# Patient Record
Sex: Female | Born: 1975 | Race: Black or African American | Hispanic: No | Marital: Single | Smoking: Current some day smoker
Health system: Southern US, Community
[De-identification: ages and names within clinical notes are randomized; demographics above are authoritative.]

## PROBLEM LIST (undated history)

## (undated) DIAGNOSIS — I10 Essential (primary) hypertension: Secondary | ICD-10-CM

---

## 2019-08-29 ENCOUNTER — Other Ambulatory Visit: Payer: Self-pay

## 2020-09-14 ENCOUNTER — Encounter (HOSPITAL_COMMUNITY): Payer: Self-pay

## 2020-09-14 ENCOUNTER — Emergency Department (HOSPITAL_COMMUNITY): Payer: No Typology Code available for payment source

## 2020-09-14 ENCOUNTER — Other Ambulatory Visit: Payer: Self-pay

## 2020-09-14 ENCOUNTER — Emergency Department (HOSPITAL_COMMUNITY)
Admission: EM | Admit: 2020-09-14 | Discharge: 2020-09-14 | Disposition: A | Discharge status: Home / Self Care | Payer: No Typology Code available for payment source | Attending: Emergency Medicine | Admitting: Emergency Medicine

## 2020-09-14 DIAGNOSIS — S99922A Unspecified injury of left foot, initial encounter: Secondary | ICD-10-CM | POA: Diagnosis present

## 2020-09-14 DIAGNOSIS — S92415A Nondisplaced fracture of proximal phalanx of left great toe, initial encounter for closed fracture: Secondary | ICD-10-CM | POA: Diagnosis not present

## 2020-09-14 DIAGNOSIS — F17210 Nicotine dependence, cigarettes, uncomplicated: Secondary | ICD-10-CM | POA: Diagnosis not present

## 2020-09-14 DIAGNOSIS — M25562 Pain in left knee: Secondary | ICD-10-CM | POA: Insufficient documentation

## 2020-09-14 DIAGNOSIS — Y990 Civilian activity done for income or pay: Secondary | ICD-10-CM | POA: Diagnosis not present

## 2020-09-14 DIAGNOSIS — I10 Essential (primary) hypertension: Secondary | ICD-10-CM | POA: Diagnosis not present

## 2020-09-14 DIAGNOSIS — W010XXA Fall on same level from slipping, tripping and stumbling without subsequent striking against object, initial encounter: Secondary | ICD-10-CM | POA: Insufficient documentation

## 2020-09-14 HISTORY — DX: Essential (primary) hypertension: I10

## 2020-09-14 MED ORDER — OXYCODONE-ACETAMINOPHEN 5-325 MG PO TABS
1.0000 | ORAL_TABLET | Freq: Once | ORAL | Status: AC
  Administered 2020-09-14: 1 via ORAL
  Filled 2020-09-14: qty 1

## 2020-09-14 NOTE — ED Provider Notes (Signed)
Emergency Medicine Provider Triage Evaluation Note  Lindsey Hodges , a 45 y.o. female  was evaluated in triage.  Pt complains of mechanical nonsyncopal fall at work.  She states that she has had pain in her left knee and left big toe since this happened.  Difficult ambulating due to pain.  No other injuries.  Review of Systems  Positive: Pain in left knee and foot Negative: Fevers, head injury  Physical Exam  BP (!) 143/78 (BP Location: Right Arm)   Pulse 72   Temp 98.7 F (37.1 C) (Oral)   Resp 16   Ht 5\' 1"  (1.549 m)   Wt 95.3 kg   SpO2 99%   BMI 39.68 kg/m  Gen:   Awake, no distress   Resp:  Normal effort  MSK:   Moves extremities without difficulty except for left knee which has pain with ROM.  Other:  Superficial abrasion over left knee  Medical Decision Making  Medically screening exam initiated at 8:50 PM.  Appropriate orders placed.  Lindsey Hodges was informed that the remainder of the evaluation will be completed by another provider, this initial triage assessment does not replace that evaluation, and the importance of remaining in the ED until their evaluation is complete.  Note: Portions of this report may have been transcribed using voice recognition software. Every effort was made to ensure accuracy; however, inadvertent computerized transcription errors may be present    Genene Churn 09/14/20 2057    2058, MD 09/14/20 2241

## 2020-09-14 NOTE — ED Provider Notes (Addendum)
[facility] EMERGENCY DEPARTMENT Provider Note   CSN: [id number] Arrival date & time: 09/14/20  1900     History Chief Complaint  Patient presents with   Knee Pain   Toe Pain    Lindsey Hodges is a 45 y.o. female.  Patient presents ER chief complaint of left knee and left foot pain.  She states that she was at work when she tripped over something and fell.  She denies head injury denies headache or loss of consciousness.  Denies neck or back pain.  No reports of fevers or cough or vomiting or diarrhea.      Past Medical History:  Diagnosis Date   Hypertension     There are no problems to display for this patient.   History reviewed. No pertinent surgical history.   OB History   No obstetric history on file.     History reviewed. No pertinent family history.  Social History   Tobacco Use   Smoking status: Some Days    Packs/day: 1.00    Types: Cigarettes   Smokeless tobacco: Never  Substance Use Topics   Alcohol use: Not Currently   Drug use: Never    Home Medications Prior to Admission medications   Not on File    Allergies    Patient has no known allergies.  Review of Systems   Review of Systems  Constitutional:  Negative for fever.  HENT:  Negative for ear pain.   Eyes:  Negative for pain.  Respiratory:  Negative for cough.   Cardiovascular:  Negative for chest pain.  Gastrointestinal:  Negative for abdominal pain.  Genitourinary:  Negative for flank pain.  Musculoskeletal:  Negative for back pain.  Skin:  Negative for rash.  Neurological:  Negative for headaches.   Physical Exam Updated Vital Signs BP (!) 141/77 (BP Location: Right Arm)   Pulse 68   Temp 98.7 F (37.1 C) (Oral)   Resp 17   Ht 5\' 1"  (1.549 m)   Wt 95.3 kg   SpO2 99%   BMI 39.68 kg/m   Physical Exam Constitutional:      General: She is not in acute distress.    Appearance: Normal appearance.  HENT:     Head: Normocephalic.     Nose: Nose  normal.  Eyes:     Extraocular Movements: Extraocular movements intact.  Cardiovascular:     Rate and Rhythm: Normal rate.  Pulmonary:     Effort: Pulmonary effort is normal.  Musculoskeletal:     Cervical back: Normal range of motion.     Comments: Moderately decreased range of motion left knee secondary to pain.  Mild abrasion seen on the left knee.  No gross deformity noted.  Tenderness and swelling seen on the left first toe.  Bilateral lower extremities are neurovascularly intact.  Neurological:     General: No focal deficit present.     Mental Status: She is alert. Mental status is at baseline.    ED Results / Procedures / Treatments   Labs (all labs ordered are listed, but only abnormal results are displayed) Labs Reviewed - No data to display  EKG None  Radiology DG Knee Complete 4 Views Left  Result Date: 09/14/2020 CLINICAL DATA:  Status post fall EXAM: LEFT KNEE - COMPLETE 4+ VIEW COMPARISON:  None. FINDINGS: No evidence of fracture or dislocation. Question small joint effusion. No lipohemarthrosis. No evidence of severe arthropathy or other focal bone abnormality. Soft tissues are unremarkable. IMPRESSION: No  acute displaced fracture or dislocation. Electronically Signed   By: Tish Frederickson M.D.   On: 09/14/2020 21:28   DG Foot Complete Left  Result Date: 09/14/2020 CLINICAL DATA:  Status post fall EXAM: LEFT FOOT - COMPLETE 3+ VIEW COMPARISON:  Fall. FINDINGS: Acute intra-articular fracture of the head and neck of the first digit proximal phalanx. No other acute displaced fracture identified. No dislocation. There is no evidence of severe arthropathy or other focal bone abnormality. Soft tissues are unremarkable. IMPRESSION: Acute intra-articular fracture of the head and neck of the first digit proximal phalanx. Electronically Signed   By: Tish Frederickson M.D.   On: 09/14/2020 21:30    Procedures Procedures   Medications Ordered in ED Medications   oxyCODONE-acetaminophen (PERCOCET/ROXICET) 5-325 MG per tablet 1 tablet (1 tablet Oral Given 09/14/20 2223)    ED Course  I have reviewed the triage vital signs and the nursing notes.  Pertinent labs & imaging results that were available during my care of the patient were reviewed by me and considered in my medical decision making (see chart for details).    MDM Rules/Calculators/A&P                           Ancillary studies consistent with left foot first toe fracture.  This was buddy taped.  Neurovascularly intact after taping.  Recommend outpatient follow-up with orthopedics within the week.  Recommend immediate return for worsening pain fevers or additional concerns. Final Clinical Impression(s) / ED Diagnoses Final diagnoses:  Closed nondisplaced fracture of proximal phalanx of left great toe, initial encounter    Rx / DC Orders ED Discharge Orders     None        Cheryll Cockayne, MD 09/14/20 2244    Cheryll Cockayne, MD 09/14/20 2245

## 2020-09-14 NOTE — Progress Notes (Signed)
Orthopedic Tech Progress Note Patient Details:  Lindsey Hodges [birth year] [id number]  Ortho Devices Type of Ortho Device: Buddy tape, Crutches, Postop shoe/boot Ortho Device/Splint Location: LLE Ortho Device/Splint Interventions: Ordered, Application, Adjustment   Post Interventions Patient Tolerated: Well Instructions Provided: Adjustment of device, Care of device, Poper ambulation with device  Lindsey Hodges 09/14/2020, 11:10 PM

## 2020-09-14 NOTE — ED Triage Notes (Signed)
Left knee pain and left big toe pain after mechanical fall while at work.   Pt having difficulty weight bear on affected knee.

## 2020-09-14 NOTE — Discharge Instructions (Addendum)
Follow-up with orthopedic surgery within the week.  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  Please contact your human resources office to inquire about which doctors they want you to follow-up with.   Return immediately back to the ER if:  Your symptoms worsen within the next 12-24 hours. You develop new symptoms such as new fevers, persistent vomiting, new pain, shortness of breath, or new weakness or numbness, or if you have any other concerns.

## 2020-12-25 ENCOUNTER — Other Ambulatory Visit: Payer: Self-pay

## 2020-12-25 ENCOUNTER — Ambulatory Visit
Admission: EM | Admit: 2020-12-25 | Discharge: 2020-12-25 | Disposition: A | Discharge status: Home / Self Care | Payer: BLUE CROSS/BLUE SHIELD

## 2020-12-25 DIAGNOSIS — J069 Acute upper respiratory infection, unspecified: Secondary | ICD-10-CM

## 2020-12-25 NOTE — ED Triage Notes (Signed)
2 day h/o cough, HA, congestion, body aches, bilateral ear pain and intermittent fever. Has been taking robitussin and using cough drops and hot tea w/ some relief. Cough is interfering with her sleep. Confirms emesis x2 and one episode of diarrhea.

## 2020-12-25 NOTE — ED Provider Notes (Signed)
[facility]    CSN: [id number] Arrival date & time: 12/25/20  9323      History   Chief Complaint Chief Complaint  Patient presents with   Cough   Otalgia    bilateral    HPI Fifi Schindler is a 45 y.o. female.   Patient here today for evaluation of congestion, headache, body aches that started 2 days ago.  She reports she has had some vomiting and diarrhea as well.  She has had some fever with T-max of 100 Fahrenheit.  She tried over-the-counter medication without significant relief.  The history is provided by the patient.  Cough Associated symptoms: ear pain, fever, headaches, myalgias and sore throat   Associated symptoms: no eye discharge, no shortness of breath and no wheezing   Otalgia Associated symptoms: congestion, cough, diarrhea, fever, headaches, sore throat and vomiting    Past Medical History:  Diagnosis Date   Hypertension     There are no problems to display for this patient.   History reviewed. No pertinent surgical history.  OB History   No obstetric history on file.      Home Medications    Prior to Admission medications   Medication Sig Start Date End Date Taking? Authorizing Provider  amLODipine (NORVASC) 10 MG tablet amlodipine 10 mg tablet  TAKE 1 TABLET BY MOUTH EVERY DAY 12/22/19  Yes [provider]  valsartan (DIOVAN) 40 MG tablet Take 40 mg by mouth daily. 10/22/20   [provider]    Family History History reviewed. No pertinent family history.  Social History Social History   Tobacco Use   Smoking status: Some Days    Packs/day: 1.00    Types: Cigarettes   Smokeless tobacco: Never  Substance Use Topics   Alcohol use: Not Currently   Drug use: Never     Allergies   Patient has no known allergies.   Review of Systems Review of Systems  Constitutional:  Positive for fever.  HENT:  Positive for congestion, ear pain and sore throat.   Eyes:  Negative for discharge and redness.   Respiratory:  Positive for cough. Negative for shortness of breath and wheezing.   Gastrointestinal:  Positive for diarrhea, nausea and vomiting.  Musculoskeletal:  Positive for myalgias.  Neurological:  Positive for headaches.    Physical Exam Triage Vital Signs ED Triage Vitals  Enc Vitals Group     BP 12/25/20 1139 138/89     Pulse Rate 12/25/20 1139 81     Resp 12/25/20 1139 18     Temp 12/25/20 1139 99 F (37.2 C)     Temp Source 12/25/20 1139 Oral     SpO2 12/25/20 1139 95 %     Weight --      Height --      Head Circumference --      Peak Flow --      Pain Score 12/25/20 1141 0     Pain Loc --      Pain Edu? --      Excl. in GC? --    No data found.  Updated Vital Signs BP 138/89 (BP Location: Left Arm)   Pulse 81   Temp 99 F (37.2 C) (Oral)   Resp 18   SpO2 95%      Physical Exam Vitals and nursing note reviewed.  Constitutional:      General: She is not in acute distress.    Appearance: Normal appearance. She is not ill-appearing.  HENT:     Head: Normocephalic and atraumatic.     Right Ear: Tympanic membrane normal.     Left Ear: Tympanic membrane normal.     Nose: Congestion present.     Mouth/Throat:     Mouth: Mucous membranes are moist.     Pharynx: No oropharyngeal exudate or posterior oropharyngeal erythema.  Eyes:     Conjunctiva/sclera: Conjunctivae normal.  Cardiovascular:     Rate and Rhythm: Normal rate and regular rhythm.     Heart sounds: Normal heart sounds. No murmur heard. Pulmonary:     Effort: Pulmonary effort is normal. No respiratory distress.     Breath sounds: Normal breath sounds. No wheezing, rhonchi or rales.  Skin:    General: Skin is warm and dry.  Neurological:     Mental Status: She is alert.  Psychiatric:        Mood and Affect: Mood normal.        Thought Content: Thought content normal.     UC Treatments / Results  Labs (all labs ordered are listed, but only abnormal results are displayed) Labs  Reviewed  COVID-19, FLU A+B NAA    EKG   Radiology No results found.  Procedures Procedures (including critical care time)  Medications Ordered in UC Medications - No data to display  Initial Impression / Assessment and Plan / UC Course  I have reviewed the triage vital signs and the nursing notes.  Pertinent labs & imaging results that were available during my care of the patient were reviewed by me and considered in my medical decision making (see chart for details).    Suspect viral etiology of symptoms.  COVID and flu screening ordered.  Patient declines empiric Tamiflu.  Commend symptomatic treatment, increase fluids and rest.  Encouraged follow-up with any further concerns.  Final Clinical Impressions(s) / UC Diagnoses   Final diagnoses:  Acute upper respiratory infection   Discharge Instructions   None    ED Prescriptions   None    PDMP not reviewed this encounter.   Tomi Bamberger, PA-C 12/25/20 1239

## 2020-12-26 LAB — COVID-19, FLU A+B NAA
Influenza A, NAA: DETECTED — AB
Influenza B, NAA: NOT DETECTED
SARS-CoV-2, NAA: NOT DETECTED

## 2021-02-18 ENCOUNTER — Encounter: Payer: Self-pay | Admitting: Emergency Medicine

## 2021-02-18 ENCOUNTER — Other Ambulatory Visit: Payer: Self-pay

## 2021-02-18 ENCOUNTER — Ambulatory Visit
Admission: EM | Admit: 2021-02-18 | Discharge: 2021-02-18 | Disposition: A | Discharge status: Home / Self Care | Payer: BLUE CROSS/BLUE SHIELD | Attending: Urgent Care | Admitting: Urgent Care

## 2021-02-18 DIAGNOSIS — H6501 Acute serous otitis media, right ear: Secondary | ICD-10-CM

## 2021-02-18 DIAGNOSIS — J209 Acute bronchitis, unspecified: Secondary | ICD-10-CM

## 2021-02-18 MED ORDER — AMOXICILLIN-POT CLAVULANATE 875-125 MG PO TABS: 1.0000 | ORAL_TABLET | Freq: Two times a day (BID) | ORAL | 0 refills | Status: DC

## 2021-02-18 MED ORDER — MONTELUKAST SODIUM 10 MG PO TABS: 10.0000 mg | ORAL_TABLET | Freq: Every day | ORAL | 0 refills | Status: DC

## 2021-02-18 MED ORDER — PREDNISONE 10 MG (21) PO TBPK: ORAL_TABLET | Freq: Every day | ORAL | 0 refills | Status: DC

## 2021-02-18 MED ORDER — HYDROCOD POLI-CHLORPHE POLI ER 10-8 MG/5ML PO SUER: 5.0000 mL | Freq: Every evening | ORAL | 0 refills | Status: DC | PRN

## 2021-02-18 NOTE — ED Triage Notes (Signed)
Cough, mild fever, nasal congestion, post nasal drip, bilateral ear pain since Friday. Denies body aches, headache, N/V/D

## 2021-02-18 NOTE — Discharge Instructions (Signed)
Start taking your Augmentin twice daily with food as discussed, do not stop taking them until gone.  Take with yogurt or probiotic Take the prednisone daily as discussed.  Best taken in the morning to prevent insomnia Please only use a cough suppressant at nighttime to help you sleep. Over-the-counter Mucinex may help break up the phlegm in your chest.  Increase your hydration.

## 2021-02-18 NOTE — ED Provider Notes (Signed)
[facility]    CSN: [id number] Arrival date & time: 02/18/21  0955      History   Chief Complaint Chief Complaint  Patient presents with   Cough    HPI Lindsey Hodges is a 46 y.o. female.   Pt c/o cough, mild fever, nasal congestion, post nasal drip, bilateral ear pain since Friday. Denies body aches, headache, N/V/D   Cough  Past Medical History:  Diagnosis Date   Hypertension     There are no problems to display for this patient.   History reviewed. No pertinent surgical history.  OB History   No obstetric history on file.      Home Medications    Prior to Admission medications   Medication Sig Start Date End Date Taking? Authorizing Provider  amoxicillin-clavulanate (AUGMENTIN) 875-125 MG tablet Take 1 tablet by mouth every 12 (twelve) hours. 02/18/21  Yes Demaya Hardge L, PA  chlorpheniramine-HYDROcodone (TUSSIONEX PENNKINETIC ER) 10-8 MG/5ML Take 5 mLs by mouth at bedtime as needed for cough. 02/18/21  Yes Shiven Junious L, PA  montelukast (SINGULAIR) 10 MG tablet Take 1 tablet (10 mg total) by mouth at bedtime. 02/18/21  Yes Yevette Knust L, PA  predniSONE (STERAPRED UNI-PAK 21 TAB) 10 MG (21) TBPK tablet Take by mouth daily. Take 6 tabs by mouth daily  for 1 days, then 5 tabs for 1 days, then 4 tabs for 1 days, then 3 tabs for 1 days, 2 tabs for 1 days, then 1 tab by mouth daily for 1 days 02/18/21  Yes Kazia Grisanti L, PA  amLODipine (NORVASC) 10 MG tablet amlodipine 10 mg tablet  TAKE 1 TABLET BY MOUTH EVERY DAY 12/22/19   [provider]  valsartan (DIOVAN) 40 MG tablet Take 40 mg by mouth daily. 10/22/20   [provider]    Family History History reviewed. No pertinent family history.  Social History Social History   Tobacco Use   Smoking status: Some Days    Packs/day: 1.00    Types: Cigarettes   Smokeless tobacco: Never  Substance Use Topics   Alcohol use: Not Currently   Drug use: Never     Allergies    Patient has no known allergies.   Review of Systems Review of Systems  Respiratory:  Positive for cough.   As per hpi  Physical Exam Triage Vital Signs ED Triage Vitals [02/18/21 1130]  Enc Vitals Group     BP 127/77     Pulse Rate 82     Resp 16     Temp 98.1 F (36.7 C)     Temp Source Oral     SpO2 95 %     Weight      Height      Head Circumference      Peak Flow      Pain Score 0     Pain Loc      Pain Edu?      Excl. in GC?    No data found.  Updated Vital Signs BP 127/77 (BP Location: Left Arm)    Pulse 82    Temp 98.1 F (36.7 C) (Oral)    Resp 16    SpO2 95%   Visual Acuity Right Eye Distance:   Left Eye Distance:   Bilateral Distance:    Right Eye Near:   Left Eye Near:    Bilateral Near:     Physical Exam Vitals and nursing note reviewed.  Constitutional:  General: She is not in acute distress.    Appearance: She is well-developed. She is obese. She is ill-appearing. She is not toxic-appearing or diaphoretic.  HENT:     Head: Normocephalic and atraumatic.     Right Ear: Ear canal and external ear normal. There is no impacted cerumen.     Left Ear: Tympanic membrane, ear canal and external ear normal. There is no impacted cerumen.     Ears:     Comments: R TM erythematous and bulging, loss of landmarks and light reflex    Nose: Congestion and rhinorrhea present.     Mouth/Throat:     Mouth: Mucous membranes are moist.     Pharynx: No oropharyngeal exudate or posterior oropharyngeal erythema.  Eyes:     General: No scleral icterus.       Right eye: No discharge.        Left eye: No discharge.     Extraocular Movements: Extraocular movements intact.     Conjunctiva/sclera: Conjunctivae normal.     Pupils: Pupils are equal, round, and reactive to light.  Cardiovascular:     Rate and Rhythm: Normal rate and regular rhythm.     Pulses: Normal pulses.     Heart sounds: Normal heart sounds. No murmur heard.   No gallop.  Pulmonary:      Effort: Pulmonary effort is normal. No respiratory distress.     Breath sounds: Normal breath sounds. No stridor. No wheezing, rhonchi or rales.  Chest:     Chest wall: No tenderness.  Abdominal:     Palpations: Abdomen is soft.     Tenderness: There is no abdominal tenderness.  Musculoskeletal:        General: No swelling or tenderness. Normal range of motion.     Cervical back: Normal range of motion and neck supple. No tenderness.     Right lower leg: No edema.     Left lower leg: No edema.  Lymphadenopathy:     Cervical: No cervical adenopathy.  Skin:    General: Skin is warm and dry.     Capillary Refill: Capillary refill takes less than 2 seconds.  Neurological:     Mental Status: She is alert.  Psychiatric:        Mood and Affect: Mood normal.     UC Treatments / Results  Labs (all labs ordered are listed, but only abnormal results are displayed) Labs Reviewed - No data to display  EKG   Radiology No results found.  Procedures Procedures (including critical care time)  Medications Ordered in UC Medications - No data to display  Initial Impression / Assessment and Plan / UC Course  I have reviewed the triage vital signs and the nursing notes.  Pertinent labs & imaging results that were available during my care of the patient were reviewed by me and considered in my medical decision making (see chart for details).     Otitis media right ear -antibiotics Tracheobronchitis -likely viral/inflammatory.  Prednisone and Mucinex  Final Clinical Impressions(s) / UC Diagnoses   Final diagnoses:  Non-recurrent acute serous otitis media of right ear  Acute tracheobronchitis     Discharge Instructions      Start taking your Augmentin twice daily with food as discussed, do not stop taking them until gone.  Take with yogurt or probiotic Take the prednisone daily as discussed.  Best taken in the morning to prevent insomnia Please only use a cough suppressant at  nighttime to help  you sleep. Over-the-counter Mucinex may help break up the phlegm in your chest.  Increase your hydration.   ED Prescriptions     Medication Sig Dispense Auth. Provider   amoxicillin-clavulanate (AUGMENTIN) 875-125 MG tablet Take 1 tablet by mouth every 12 (twelve) hours. 14 tablet Mendell Bontempo L, PA   predniSONE (STERAPRED UNI-PAK 21 TAB) 10 MG (21) TBPK tablet Take by mouth daily. Take 6 tabs by mouth daily  for 1 days, then 5 tabs for 1 days, then 4 tabs for 1 days, then 3 tabs for 1 days, 2 tabs for 1 days, then 1 tab by mouth daily for 1 days 21 tablet Latonyia Lopata L, PA   montelukast (SINGULAIR) 10 MG tablet Take 1 tablet (10 mg total) by mouth at bedtime. 30 tablet Shonia Skilling L, PA   chlorpheniramine-HYDROcodone (TUSSIONEX PENNKINETIC ER) 10-8 MG/5ML Take 5 mLs by mouth at bedtime as needed for cough. 30 mL Lakeena Downie L, PA      PDMP not reviewed this encounter.   Maretta Bees, [place] 02/18/21 2143

## 2022-01-31 ENCOUNTER — Ambulatory Visit
Admission: EM | Admit: 2022-01-31 | Discharge: 2022-01-31 | Disposition: A | Discharge status: Home / Self Care | Payer: BLUE CROSS/BLUE SHIELD | Attending: Urgent Care | Admitting: Urgent Care

## 2022-01-31 DIAGNOSIS — R062 Wheezing: Secondary | ICD-10-CM | POA: Insufficient documentation

## 2022-01-31 DIAGNOSIS — F17210 Nicotine dependence, cigarettes, uncomplicated: Secondary | ICD-10-CM | POA: Diagnosis not present

## 2022-01-31 DIAGNOSIS — Z1152 Encounter for screening for COVID-19: Secondary | ICD-10-CM | POA: Diagnosis not present

## 2022-01-31 DIAGNOSIS — Z79899 Other long term (current) drug therapy: Secondary | ICD-10-CM | POA: Insufficient documentation

## 2022-01-31 DIAGNOSIS — I10 Essential (primary) hypertension: Secondary | ICD-10-CM | POA: Diagnosis present

## 2022-01-31 DIAGNOSIS — F17200 Nicotine dependence, unspecified, uncomplicated: Secondary | ICD-10-CM

## 2022-01-31 DIAGNOSIS — B349 Viral infection, unspecified: Secondary | ICD-10-CM | POA: Diagnosis present

## 2022-01-31 DIAGNOSIS — R0602 Shortness of breath: Secondary | ICD-10-CM | POA: Insufficient documentation

## 2022-01-31 MED ORDER — ALBUTEROL SULFATE HFA 108 (90 BASE) MCG/ACT IN AERS: 1.0000 | INHALATION_SPRAY | Freq: Four times a day (QID) | RESPIRATORY_TRACT | 0 refills | Status: DC | PRN

## 2022-01-31 MED ORDER — PREDNISONE 20 MG PO TABS: 40.0000 mg | ORAL_TABLET | Freq: Every day | ORAL | 0 refills | Status: DC

## 2022-01-31 MED ORDER — BENZONATATE 100 MG PO CAPS: 100.0000 mg | ORAL_CAPSULE | Freq: Three times a day (TID) | ORAL | 0 refills | Status: DC | PRN

## 2022-01-31 MED ORDER — PROMETHAZINE-DM 6.25-15 MG/5ML PO SYRP: 2.5000 mL | ORAL_SOLUTION | Freq: Three times a day (TID) | ORAL | 0 refills | Status: DC | PRN

## 2022-01-31 MED ORDER — OSELTAMIVIR PHOSPHATE 75 MG PO CAPS: 75.0000 mg | ORAL_CAPSULE | Freq: Two times a day (BID) | ORAL | 0 refills | Status: DC

## 2022-01-31 NOTE — ED Triage Notes (Signed)
Pt presents to uc with co of cough congestion, vomiting and nausea, otalgia, facial pain, cp and body aches since last night. Pt reports no otc medications. Pt reports no other sick contacts.

## 2022-01-31 NOTE — ED Provider Notes (Signed)
[facility]  Note:  This document was prepared using Dragon voice recognition software and may include unintentional dictation errors.  MRN: [id number] DOB: 06-29-1975  Subjective:   Lindsey Hodges is a 47 y.o. female presenting for 1-2 day history of acute onset sinus congestion, coughing, facial pain, bilateral ear pain, chest pain and bodyaches. Has had shob, wheezing. Patient is a smoker, does 1/2ppd. No history of asthma.  No current facility-administered medications for this encounter.  Current Outpatient Medications:    amLODipine (NORVASC) 10 MG tablet, amlodipine 10 mg tablet  TAKE 1 TABLET BY MOUTH EVERY DAY, Disp: , Rfl:    valsartan (DIOVAN) 40 MG tablet, Take 40 mg by mouth daily., Disp: , Rfl:    No Known Allergies  Past Medical History:  Diagnosis Date   Hypertension      No past surgical history on file.  No family history on file.  Social History   Tobacco Use   Smoking status: Some Days    Packs/day: 1.00    Types: Cigarettes   Smokeless tobacco: Never  Substance Use Topics   Alcohol use: Not Currently   Drug use: Never    ROS   Objective:   Vitals: BP 137/86   Pulse (!) 105   Temp 98.8 F (37.1 C)   Resp 18   LMP 01/30/2022   SpO2 98%   Physical Exam Constitutional:      General: She is not in acute distress.    Appearance: Normal appearance. She is well-developed. She is obese. She is not ill-appearing, toxic-appearing or diaphoretic.  HENT:     Head: Normocephalic and atraumatic.     Right Ear: Tympanic membrane, ear canal and external ear normal. No drainage or tenderness. No middle ear effusion. There is no impacted cerumen. Tympanic membrane is not erythematous or bulging.     Left Ear: Tympanic membrane, ear canal and external ear normal. No drainage or tenderness.  No middle ear effusion. There is no impacted cerumen. Tympanic membrane is not erythematous or bulging.     Nose: Nose normal. No congestion or  rhinorrhea.     Mouth/Throat:     Mouth: Mucous membranes are moist. No oral lesions.     Pharynx: No pharyngeal swelling, oropharyngeal exudate, posterior oropharyngeal erythema or uvula swelling.     Tonsils: No tonsillar exudate or tonsillar abscesses.  Eyes:     General: No scleral icterus.       Right eye: No discharge.        Left eye: No discharge.     Extraocular Movements: Extraocular movements intact.     Right eye: Normal extraocular motion.     Left eye: Normal extraocular motion.     Conjunctiva/sclera: Conjunctivae normal.  Cardiovascular:     Rate and Rhythm: Normal rate and regular rhythm.     Heart sounds: Normal heart sounds. No murmur heard.    No friction rub. No gallop.  Pulmonary:     Effort: Pulmonary effort is normal. No respiratory distress.     Breath sounds: No stridor. No wheezing, rhonchi or rales.  Chest:     Chest wall: No tenderness.  Musculoskeletal:     Cervical back: Normal range of motion and neck supple.  Lymphadenopathy:     Cervical: No cervical adenopathy.  Skin:    General: Skin is warm and dry.  Neurological:     General: No focal deficit present.     Mental Status: She is alert and oriented  to person, place, and time.  Psychiatric:        Mood and Affect: Mood normal.        Behavior: Behavior normal.     Assessment and Plan :   PDMP not reviewed this encounter.  1. Acute viral syndrome   2. Smoker   3. Essential hypertension     Given her respiratory symptoms, current active smoking recommended an oral prednisone course and albuterol for her shortness of breath, wheezing and chest tightness. Deferred imaging given clear cardiopulmonary exam, hemodynamically stable vital signs. Will start Tamiflu for empiric treatment given current incidence in the community and overall symptom set.  If she test positive for COVID-19, she is to stop Tamiflu and start taking Paxlovid.  Otherwise, use supportive care for an acute viral syndrome.   Counseled patient on potential for adverse effects with medications prescribed/recommended today, ER and return-to-clinic precautions discussed, patient verbalized understanding.    Jaynee Eagles, [place] 01/31/22 [phone]

## 2022-01-31 NOTE — Discharge Instructions (Signed)
We will notify you of your test results as they arrive and may take between about 24 hours.  I encourage you to sign up for MyChart if you have not already done so as this can be the easiest way for [place] to communicate results to you online or through a phone app.  Generally, we only contact you if it is a positive test result.  In the meantime, if you develop worsening symptoms including fever, chest pain, shortness of breath despite our current treatment plan then please report to the emergency room as this may be a sign of worsening status from possible viral infection.  Otherwise, we will manage this as a viral syndrome like influenza with Tamiflu. If your COVID test is positive, then stop Tamiflu and start Paxlovid. For sore throat or cough try using a honey-based tea. Use 3 teaspoons of honey with juice squeezed from half lemon. Place shaved pieces of ginger into 1/2-1 cup of water and warm over stove top. Then mix the ingredients and repeat every 4 hours as needed. Please take Tylenol 500mg -650mg  every 6 hours for aches and pains, fevers. Hydrate very well with at least 2 liters of water. Eat light meals such as soups to replenish electrolytes and soft fruits, veggies. Start an antihistamine like Zyrtec for postnasal drainage, sinus congestion.  You can take this together with prednisone and albuterol.  Use the cough medications as needed.

## 2022-02-01 LAB — SARS CORONAVIRUS 2 (TAT 6-24 HRS): SARS Coronavirus 2: NEGATIVE

## 2022-08-29 ENCOUNTER — Encounter (HOSPITAL_COMMUNITY): Payer: Self-pay | Admitting: *Deleted

## 2022-08-29 ENCOUNTER — Other Ambulatory Visit: Payer: Self-pay

## 2022-08-29 ENCOUNTER — Emergency Department (HOSPITAL_COMMUNITY)
Admission: EM | Admit: 2022-08-29 | Discharge: 2022-08-29 | Disposition: A | Discharge status: Home / Self Care | Payer: BLUE CROSS/BLUE SHIELD | Attending: Emergency Medicine | Admitting: Emergency Medicine

## 2022-08-29 ENCOUNTER — Emergency Department (HOSPITAL_COMMUNITY): Payer: BLUE CROSS/BLUE SHIELD

## 2022-08-29 DIAGNOSIS — W0110XA Fall on same level from slipping, tripping and stumbling with subsequent striking against unspecified object, initial encounter: Secondary | ICD-10-CM | POA: Diagnosis not present

## 2022-08-29 DIAGNOSIS — S32111A Minimally displaced Zone I fracture of sacrum, initial encounter for closed fracture: Secondary | ICD-10-CM | POA: Insufficient documentation

## 2022-08-29 DIAGNOSIS — M545 Low back pain, unspecified: Secondary | ICD-10-CM | POA: Diagnosis present

## 2022-08-29 LAB — PREGNANCY, URINE: Preg Test, Ur: NEGATIVE

## 2022-08-29 MED ORDER — CYCLOBENZAPRINE HCL 5 MG PO TABS: 5.0000 mg | ORAL_TABLET | Freq: Three times a day (TID) | ORAL | 0 refills | Status: AC | PRN

## 2022-08-29 MED ORDER — HYDROCODONE-ACETAMINOPHEN 5-325 MG PO TABS: 1.0000 | ORAL_TABLET | Freq: Four times a day (QID) | ORAL | 0 refills | Status: AC | PRN

## 2022-08-29 MED ORDER — OXYCODONE-ACETAMINOPHEN 5-325 MG PO TABS: 1.0000 | ORAL_TABLET | Freq: Four times a day (QID) | ORAL | 0 refills | Status: AC | PRN

## 2022-08-29 MED ORDER — IBUPROFEN 800 MG PO TABS
800.0000 mg | ORAL_TABLET | Freq: Once | ORAL | Status: AC
  Administered 2022-08-29: 800 mg via ORAL
  Filled 2022-08-29: qty 1

## 2022-08-29 MED ORDER — CYCLOBENZAPRINE HCL 5 MG PO TABS: 5.0000 mg | ORAL_TABLET | Freq: Three times a day (TID) | ORAL | 0 refills | Status: DC | PRN

## 2022-08-29 MED ORDER — HYDROCODONE-ACETAMINOPHEN 5-325 MG PO TABS
1.0000 | ORAL_TABLET | Freq: Once | ORAL | Status: AC
  Administered 2022-08-29: 1 via ORAL
  Filled 2022-08-29: qty 1
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
## 2022-08-29 NOTE — Discharge Instructions (Signed)
You have a sacral fracture.  You are expected to have some pain  Take Motrin for pain and Flexeril for muscle spasm  I have prescribed a short course of pain medicine  See your doctor for follow-up.  I have referred you to orthopedic doctor if you have persistent pain  Return to ER if you have worse pain, unable to walk

## 2022-08-29 NOTE — ED Notes (Signed)
Dc instructions and scripts reviewed with pt no questions or concerns at this time will follow up with ortho. Pt wheeled out to vehicle to be transported home.
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
## 2022-08-29 NOTE — ED Notes (Signed)
Patient transported to X-ray 

## 2023-01-17 ENCOUNTER — Ambulatory Visit (INDEPENDENT_AMBULATORY_CARE_PROVIDER_SITE_OTHER): Payer: BLUE CROSS/BLUE SHIELD

## 2023-01-17 ENCOUNTER — Ambulatory Visit: Payer: BLUE CROSS/BLUE SHIELD

## 2023-01-17 ENCOUNTER — Ambulatory Visit
Admission: EM | Admit: 2023-01-17 | Discharge: 2023-01-17 | Disposition: A | Discharge status: Home / Self Care | Payer: BLUE CROSS/BLUE SHIELD | Attending: Physician Assistant | Admitting: Physician Assistant

## 2023-01-17 DIAGNOSIS — R509 Fever, unspecified: Secondary | ICD-10-CM | POA: Diagnosis not present

## 2023-01-17 DIAGNOSIS — R051 Acute cough: Secondary | ICD-10-CM

## 2023-01-17 DIAGNOSIS — J189 Pneumonia, unspecified organism: Secondary | ICD-10-CM | POA: Diagnosis not present

## 2023-01-17 DIAGNOSIS — R062 Wheezing: Secondary | ICD-10-CM

## 2023-01-17 MED ORDER — DOXYCYCLINE HYCLATE 100 MG PO CAPS: 100.0000 mg | ORAL_CAPSULE | Freq: Two times a day (BID) | ORAL | 0 refills | Status: AC

## 2023-01-17 MED ORDER — PROMETHAZINE-DM 6.25-15 MG/5ML PO SYRP: 5.0000 mL | ORAL_SOLUTION | Freq: Two times a day (BID) | ORAL | 0 refills | Status: AC | PRN

## 2023-01-17 MED ORDER — ACETAMINOPHEN 325 MG PO TABS
650.0000 mg | ORAL_TABLET | Freq: Once | ORAL | Status: AC
  Administered 2023-01-17: 650 mg via ORAL

## 2023-01-17 MED ORDER — IPRATROPIUM-ALBUTEROL 0.5-2.5 (3) MG/3ML IN SOLN
3.0000 mL | Freq: Once | RESPIRATORY_TRACT | Status: AC
  Administered 2023-01-17: 3 mL via RESPIRATORY_TRACT

## 2023-01-17 MED ORDER — PREDNISONE 20 MG PO TABS: 40.0000 mg | ORAL_TABLET | Freq: Every day | ORAL | 0 refills | Status: AC

## 2023-01-17 MED ORDER — METHYLPREDNISOLONE ACETATE 80 MG/ML IJ SUSP
60.0000 mg | Freq: Once | INTRAMUSCULAR | Status: AC
  Administered 2023-01-17: 60 mg via INTRAMUSCULAR

## 2023-01-17 MED ORDER — ALBUTEROL SULFATE HFA 108 (90 BASE) MCG/ACT IN AERS: 1.0000 | INHALATION_SPRAY | Freq: Four times a day (QID) | RESPIRATORY_TRACT | 0 refills | Status: AC | PRN

## 2023-01-17 NOTE — ED Provider Notes (Signed)
[facility]    CSN: [id number] Arrival date & time: 01/17/23  1212      History   Chief Complaint No chief complaint on file.   HPI Lindsey Hodges is a 47 y.o. female.   Patient presents today with a weeklong history of URI symptoms including cough.  She also reports some mild congestion, body aches, fatigue, malaise.  She has not measured her temperature but has been feeling poorly.  Denies any chest pain, nausea, vomiting.  She has tried multiple over-the-counter medication including Mucinex, DayQuil, NyQuil, Robitussin without improvement of symptoms.  She denies any history of asthma or COPD but does smoke.  Denies any recent antibiotics or steroids.  She denies history of diabetes.  Denies any known sick contacts.  She is having difficulty with her daily activities as a result of symptoms.  She has no concern for pregnancy.    Past Medical History:  Diagnosis Date   Hypertension     There are no active problems to display for this patient.   History reviewed. No pertinent surgical history.  OB History   No obstetric history on file.      Home Medications    Prior to Admission medications   Medication Sig Start Date End Date Taking? Authorizing Provider  albuterol (VENTOLIN HFA) 108 (90 Base) MCG/ACT inhaler Inhale 1-2 puffs into the lungs every 6 (six) hours as needed for wheezing or shortness of breath. 01/17/23  Yes Dafney Farler K, PA-C  doxycycline (VIBRAMYCIN) 100 MG capsule Take 1 capsule (100 mg total) by mouth 2 (two) times daily. 01/17/23  Yes Ovid Witman K, PA-C  predniSONE (DELTASONE) 20 MG tablet Take 2 tablets (40 mg total) by mouth daily for 5 days. 01/17/23 01/22/23 Yes Madylyn Insco K, PA-C  promethazine-dextromethorphan (PROMETHAZINE-DM) 6.25-15 MG/5ML syrup Take 5 mLs by mouth 2 (two) times daily as needed for cough. 01/17/23  Yes Ellard Nan K, PA-C  amLODipine (NORVASC) 10 MG tablet amlodipine 10 mg tablet  TAKE 1 TABLET BY MOUTH  EVERY DAY 12/22/19   [provider]  cyclobenzaprine (FLEXERIL) 5 MG tablet Take 1 tablet (5 mg total) by mouth 3 (three) times daily as needed. 08/29/22   Charlynne Pander, MD  HYDROcodone-acetaminophen (NORCO/VICODIN) 5-325 MG tablet Take 1 tablet by mouth every 6 (six) hours as needed. 08/29/22   Charlynne Pander, MD  oxyCODONE-acetaminophen (PERCOCET) 5-325 MG tablet Take 1 tablet by mouth every 6 (six) hours as needed. 08/29/22   Charlynne Pander, MD  valsartan (DIOVAN) 40 MG tablet Take 40 mg by mouth daily. 10/22/20   [provider]    Family History History reviewed. No pertinent family history.  Social History Social History   Tobacco Use   Smoking status: Some Days    Current packs/day: 0.25    Types: Cigarettes   Smokeless tobacco: Never  Substance Use Topics   Alcohol use: Not Currently   Drug use: Never     Allergies   Patient has no known allergies.   Review of Systems Review of Systems  Constitutional:  Positive for activity change, chills, fatigue and fever. Negative for appetite change.  HENT:  Positive for congestion. Negative for sinus pressure, sneezing and sore throat.   Respiratory:  Positive for cough. Negative for shortness of breath.   Cardiovascular:  Negative for chest pain.  Gastrointestinal:  Negative for abdominal pain, diarrhea, nausea and vomiting.  Musculoskeletal:  Positive for arthralgias and myalgias.     Physical Exam  Triage Vital Signs ED Triage Vitals  Encounter Vitals Group     BP 01/17/23 1401 (!) 141/74     Systolic BP Percentile --      Diastolic BP Percentile --      Pulse Rate 01/17/23 1401 99     Resp 01/17/23 1401 16     Temp 01/17/23 1401 (!) 100.5 F (38.1 C)     Temp Source 01/17/23 1401 Oral     SpO2 01/17/23 1401 97 %     Weight 01/17/23 1400 220 lb (99.8 kg)     Height 01/17/23 1400 5\' 1"  (1.549 m)     Head Circumference --      Peak Flow --      Pain Score 01/17/23 1400 6     Pain Loc --       Pain Education --      Exclude from Growth Chart --    No data found.  Updated Vital Signs BP (!) 141/74 (BP Location: Left Arm)   Pulse 99   Temp 99.5 F (37.5 C) (Oral)   Resp 16   Ht 5\' 1"  (1.549 m)   Wt 220 lb (99.8 kg)   LMP 01/04/2023 (Exact Date)   SpO2 97%   BMI 41.57 kg/m   Visual Acuity Right Eye Distance:   Left Eye Distance:   Bilateral Distance:    Right Eye Near:   Left Eye Near:    Bilateral Near:     Physical Exam Vitals reviewed.  Constitutional:      General: She is awake. She is not in acute distress.    Appearance: Normal appearance. She is well-developed. She is not ill-appearing.     Comments: Very pleasant female appears stated age in no acute distress sitting comfortably in exam room  HENT:     Head: Normocephalic and atraumatic.     Right Ear: Tympanic membrane, ear canal and external ear normal. Tympanic membrane is not erythematous or bulging.     Left Ear: Tympanic membrane, ear canal and external ear normal. Tympanic membrane is not erythematous or bulging.     Nose:     Right Sinus: No maxillary sinus tenderness or frontal sinus tenderness.     Left Sinus: No maxillary sinus tenderness or frontal sinus tenderness.     Mouth/Throat:     Pharynx: Uvula midline. No oropharyngeal exudate or posterior oropharyngeal erythema.  Cardiovascular:     Rate and Rhythm: Normal rate and regular rhythm.     Heart sounds: Normal heart sounds, S1 normal and S2 normal. No murmur heard. Pulmonary:     Effort: Pulmonary effort is normal.     Breath sounds: Wheezing and rhonchi present. No rales.  Psychiatric:        Behavior: Behavior is cooperative.      UC Treatments / Results  Labs (all labs ordered are listed, but only abnormal results are displayed) Labs Reviewed - No data to display  EKG   Radiology No results found.  Procedures Procedures (including critical care time)  Medications Ordered in UC Medications  acetaminophen  (TYLENOL) tablet 650 mg (650 mg Oral Given 01/17/23 1408)  ipratropium-albuterol (DUONEB) 0.5-2.5 (3) MG/3ML nebulizer solution 3 mL (3 mLs Nebulization Given 01/17/23 1442)  methylPREDNISolone acetate (DEPO-MEDROL) injection 60 mg (60 mg Intramuscular Given 01/17/23 1442)    Initial Impression / Assessment and Plan / UC Course  I have reviewed the triage vital signs and the nursing notes.  Pertinent labs & imaging  results that were available during my care of the patient were reviewed by me and considered in my medical decision making (see chart for details).     Patient was initially febrile but otherwise well-appearing, nontoxic, nontachycardic.  She was given Tylenol with normalization of her temperature.  Viral testing was deferred as she has been symptomatic for over a week.  Chest x-ray was obtained that showed patchy infiltrates consistent with atypical pneumonia based on my primary read.  At the time of discharge we were waiting for radiologist over read and we will contact her if they see a focal consolidation and she requires a second antibiotic.  She was started on doxycycline today and we discussed that she should avoid prolonged sun exposure while on this medication due to associated photosensitivity.  During visit she was given a DuoNeb and Depo-Medrol with improvement of symptoms.  She was sent home with albuterol inhaler with instruction to use this every 4-6 hours as needed.  Will start prednisone burst of 40 mg for 5 days tomorrow (01/18/2023) and she is not to take NSAIDs with this medication.  She was given Promethazine DM for cough.  We discussed that this is sedating and she should not drive drink alcohol with taking it.  She is to rest and drink plenty of fluids.  She can use over-the-counter medication for additional symptom relief.  If she is not improving within 3 to 5 days she is to return for reevaluation.  If anything worsens and she has high fever, worsening cough,  shortness of breath, chest pain, nausea, vomiting she needs to be seen immediately.  Strict return precautions given.  Work excuse note provided.  Final Clinical Impressions(s) / UC Diagnoses   Final diagnoses:  Acute cough  Atypical pneumonia  Wheezing  Fever, unspecified     Discharge Instructions      I am concerned that you have developed atypical pneumonia.  Start doxycycline 100 mg twice daily for 10 days.  Stay out of the sun while on this medication.  We gave an injection of steroids today so please start prednisone tomorrow (01/18/2023).  Do not take NSAIDs with this medication including aspirin, ibuprofen/Advil, naproxen/Aleve.  Use albuterol every 4-6 hours as needed.  Take Promethazine DM for cough.  This will make you sleepy so do not drive or drink alcohol with taking it.  If your symptoms are not improving within 3 to 5 days you should be reevaluated.  If anything worsens and you have high fever not responding to medication, worsening cough, shortness of breath, chest pain, nausea, vomiting you need to be seen immediately.     ED Prescriptions     Medication Sig Dispense Auth. Provider   doxycycline (VIBRAMYCIN) 100 MG capsule Take 1 capsule (100 mg total) by mouth 2 (two) times daily. 20 capsule Liliana Brentlinger K, PA-C   predniSONE (DELTASONE) 20 MG tablet Take 2 tablets (40 mg total) by mouth daily for 5 days. 10 tablet Berley Gambrell K, PA-C   promethazine-dextromethorphan (PROMETHAZINE-DM) 6.25-15 MG/5ML syrup Take 5 mLs by mouth 2 (two) times daily as needed for cough. 118 mL Kahlel Peake K, PA-C   albuterol (VENTOLIN HFA) 108 (90 Base) MCG/ACT inhaler Inhale 1-2 puffs into the lungs every 6 (six) hours as needed for wheezing or shortness of breath. 18 g Timi Reeser K, PA-C      PDMP not reviewed this encounter.   Jeani Hawking, PA-C 01/17/23 1536

## 2023-01-17 NOTE — ED Triage Notes (Signed)
Patient presents with chest congestion and cough x 1 week and body aches started today. Treated with Mucinex, Dayquil, Nyquil and Robitussin.

## 2023-01-17 NOTE — Discharge Instructions (Signed)
I am concerned that you have developed atypical pneumonia.  Start doxycycline 100 mg twice daily for 10 days.  Stay out of the sun while on this medication.  We gave an injection of steroids today so please start prednisone tomorrow (01/18/2023).  Do not take NSAIDs with this medication including aspirin, ibuprofen/Advil, naproxen/Aleve.  Use albuterol every 4-6 hours as needed.  Take Promethazine DM for cough.  This will make you sleepy so do not drive or drink alcohol with taking it.  If your symptoms are not improving within 3 to 5 days you should be reevaluated.  If anything worsens and you have high fever not responding to medication, worsening cough, shortness of breath, chest pain, nausea, vomiting you need to be seen immediately.

## 2023-03-15 IMAGING — CR DG KNEE COMPLETE 4+V*L*
4 series · 4 of 4 positions shown · non-contrast
Comparison: None.

CLINICAL DATA: Status post fall

EXAM:
LEFT KNEE - COMPLETE 4+ VIEW

[knee ap]
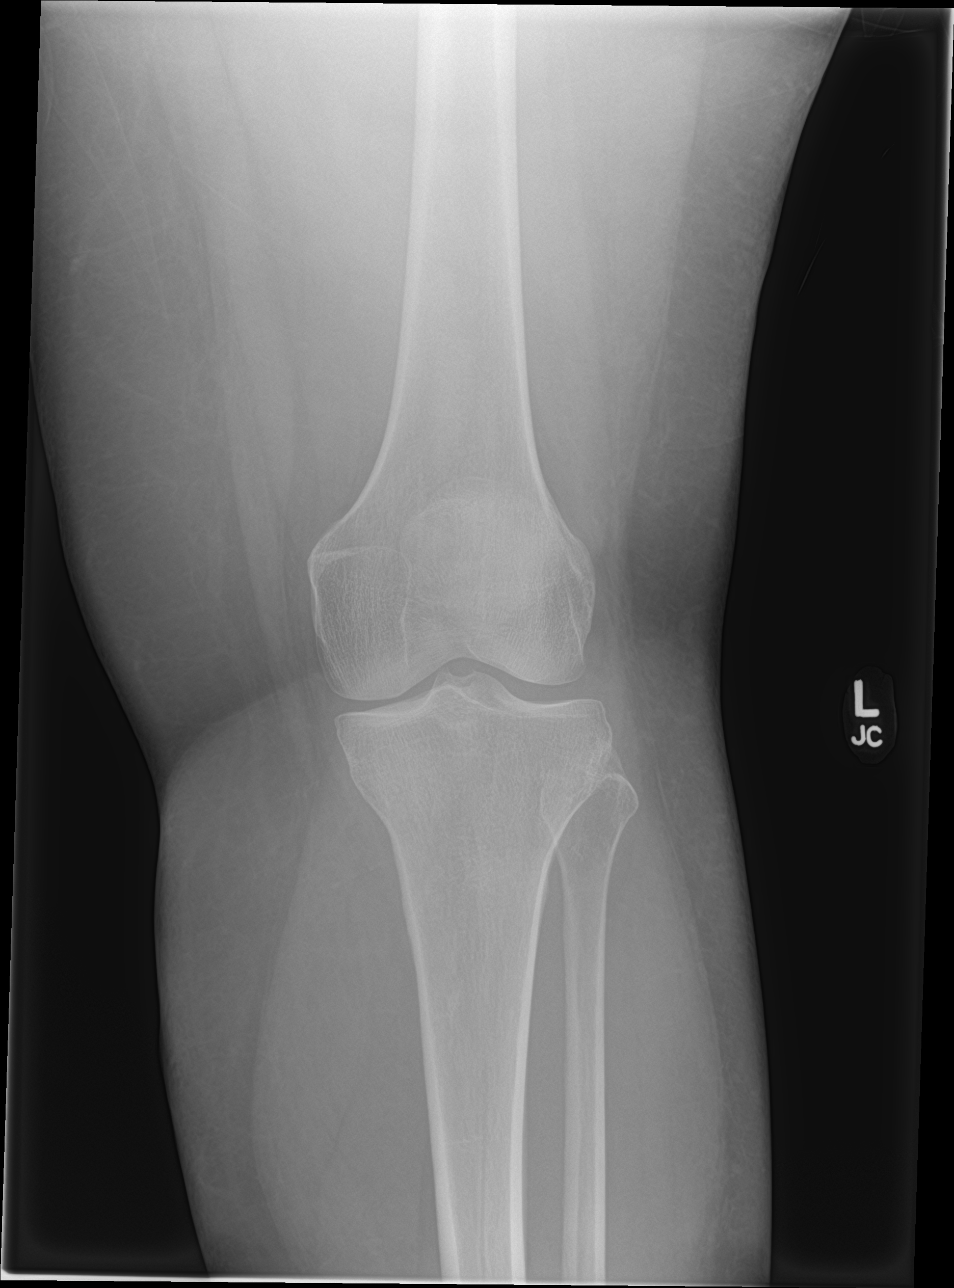

[knee obl (1 of 2)]
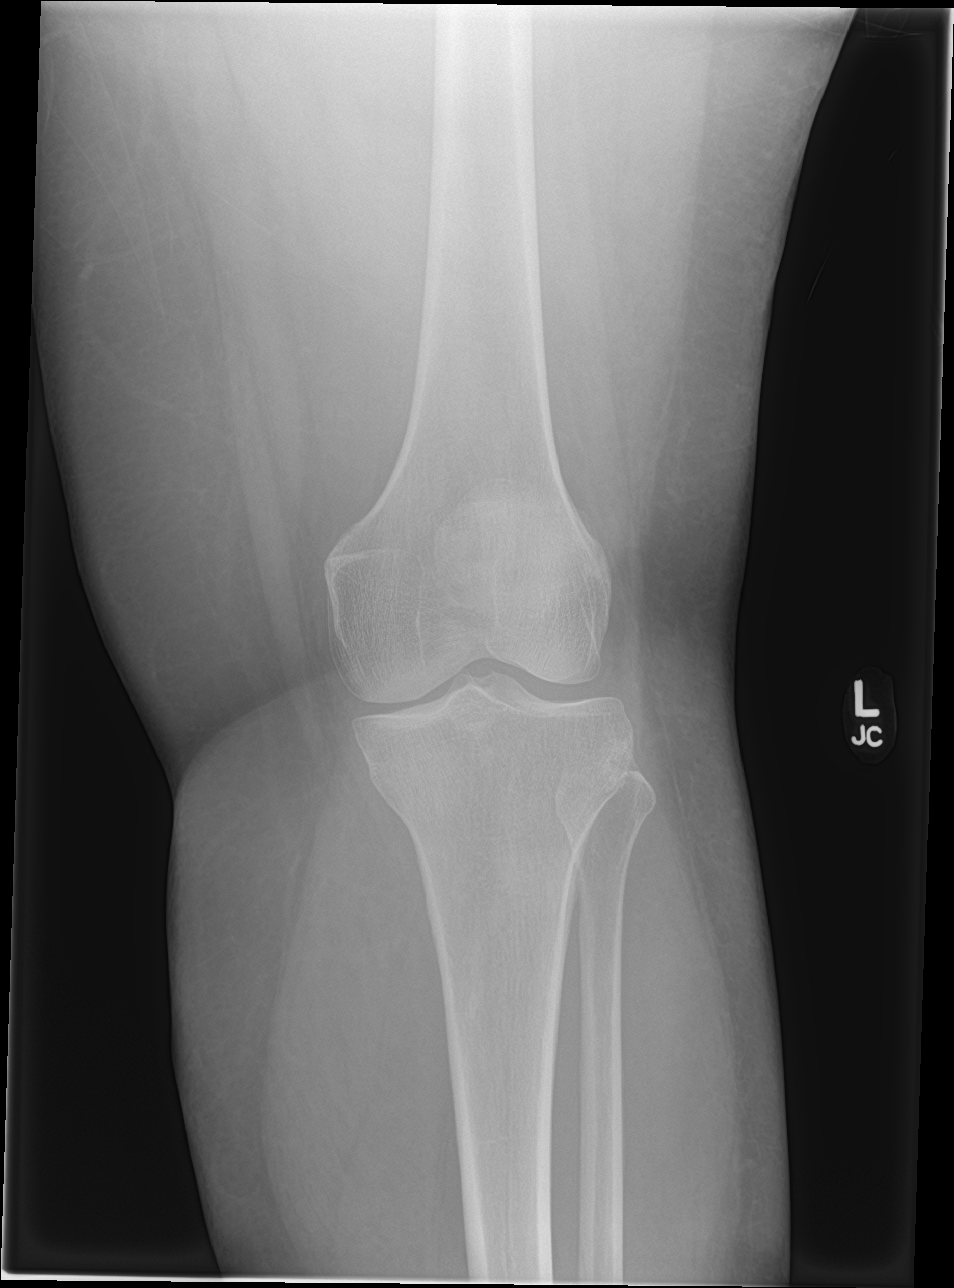

[knee obl (2 of 2)]
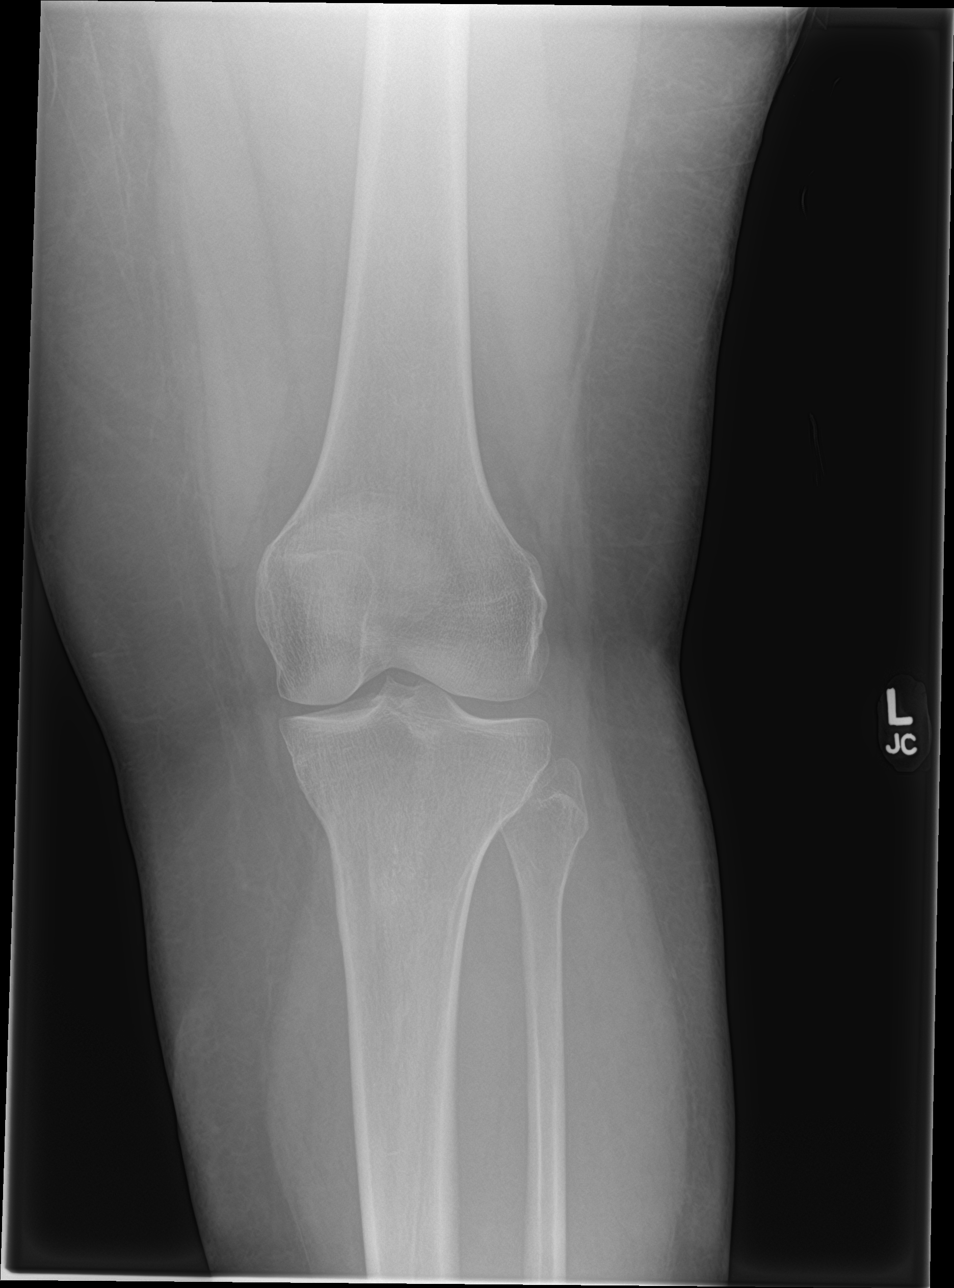

[knee lat]
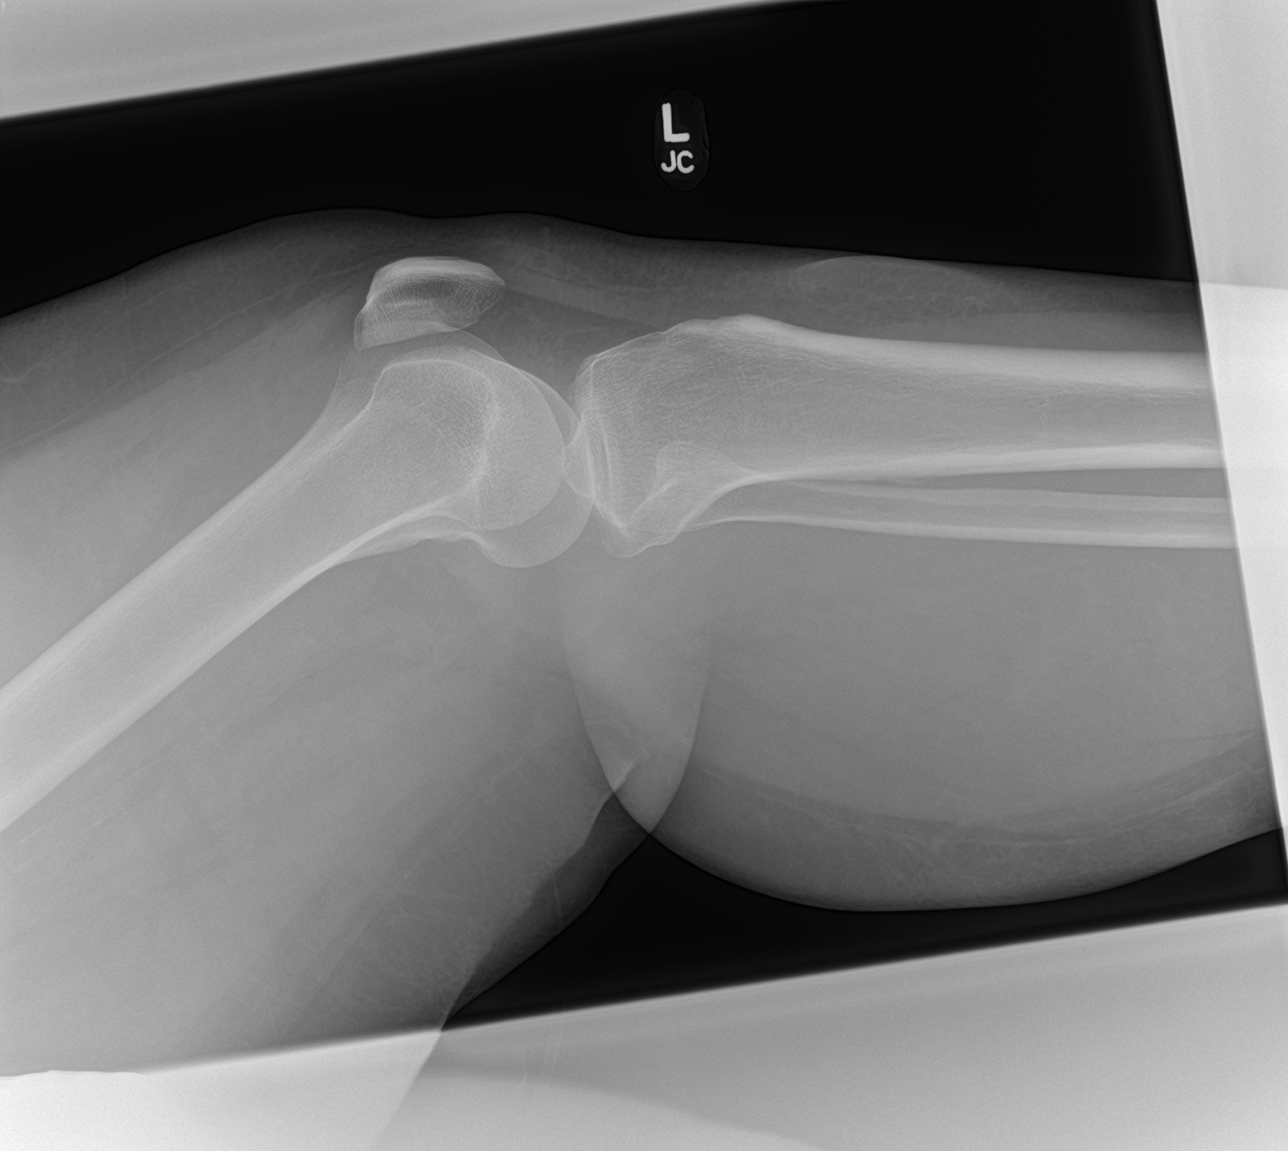

[4 of 4 positions shown; findings below may reference images not displayed]

FINDINGS: No evidence of fracture or dislocation. Question small joint
effusion. No lipohemarthrosis. No evidence of severe arthropathy or
other focal bone abnormality. Soft tissues are unremarkable.
IMPRESSION: No acute displaced fracture or dislocation.

## 2023-03-15 IMAGING — CR DG FOOT COMPLETE 3+V*L*
3 series · 3 of 3 positions shown · non-contrast
Comparison: Fall.

CLINICAL DATA: Status post fall

EXAM:
LEFT FOOT - COMPLETE 3+ VIEW

[foot ap]
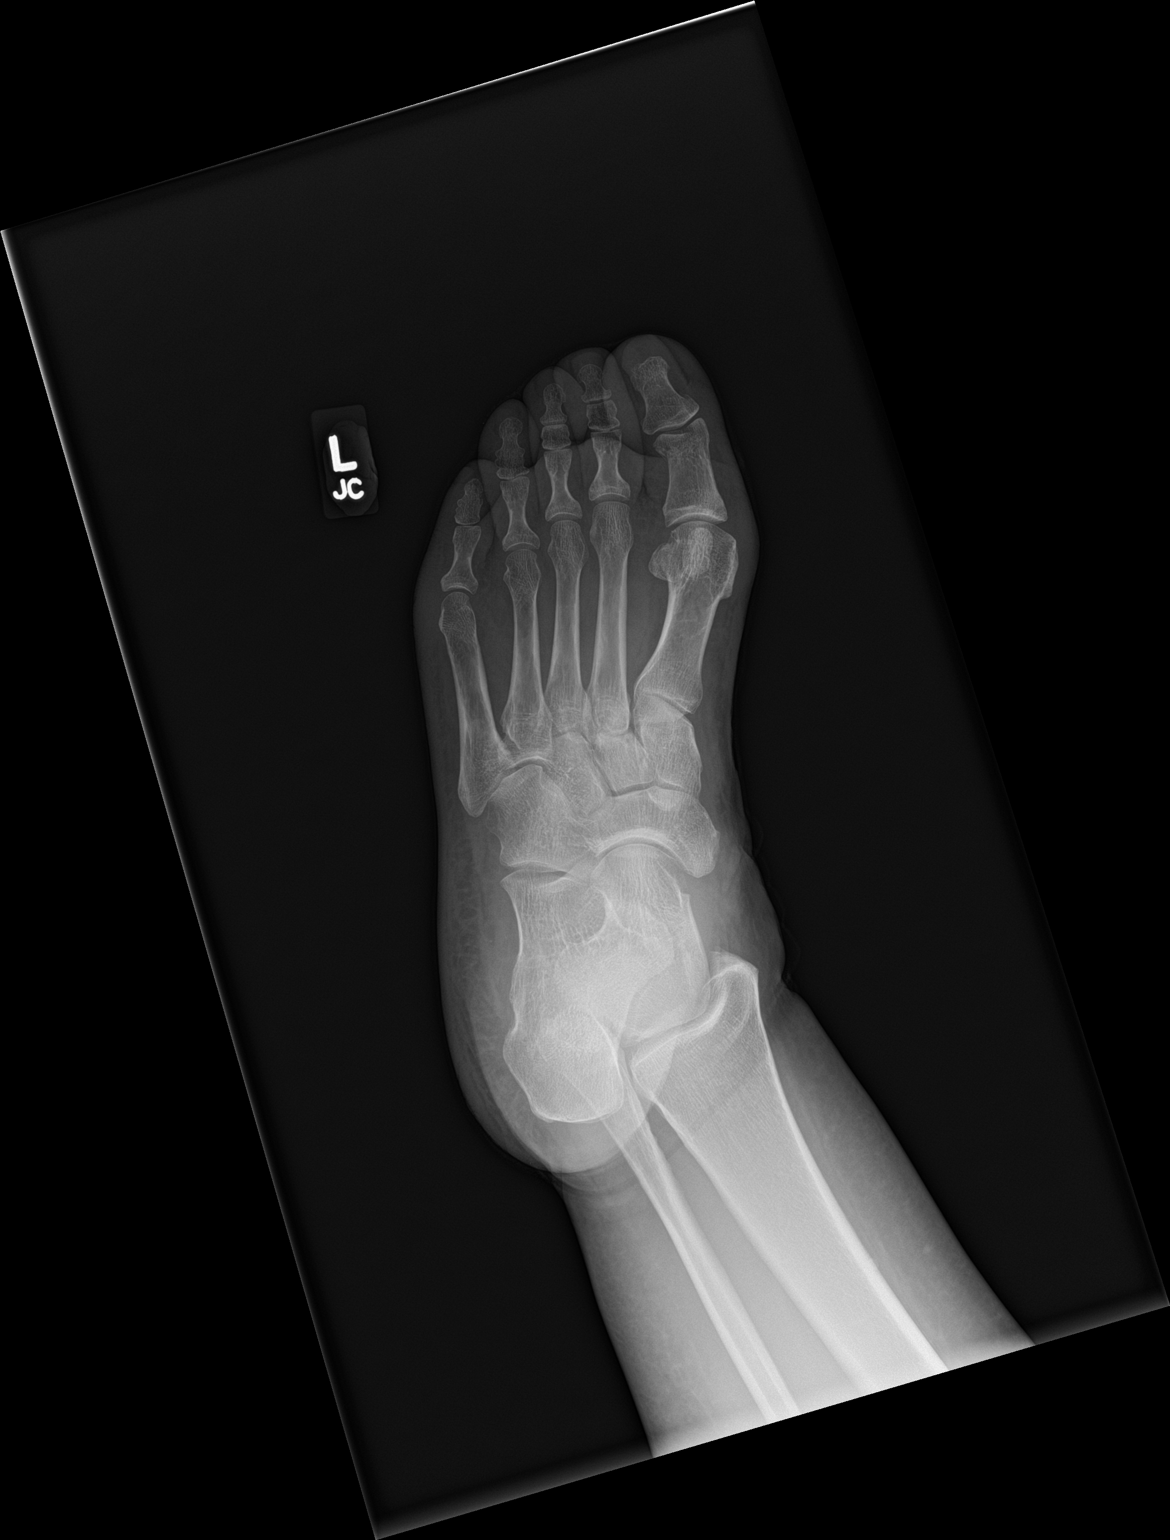

[foot obl]
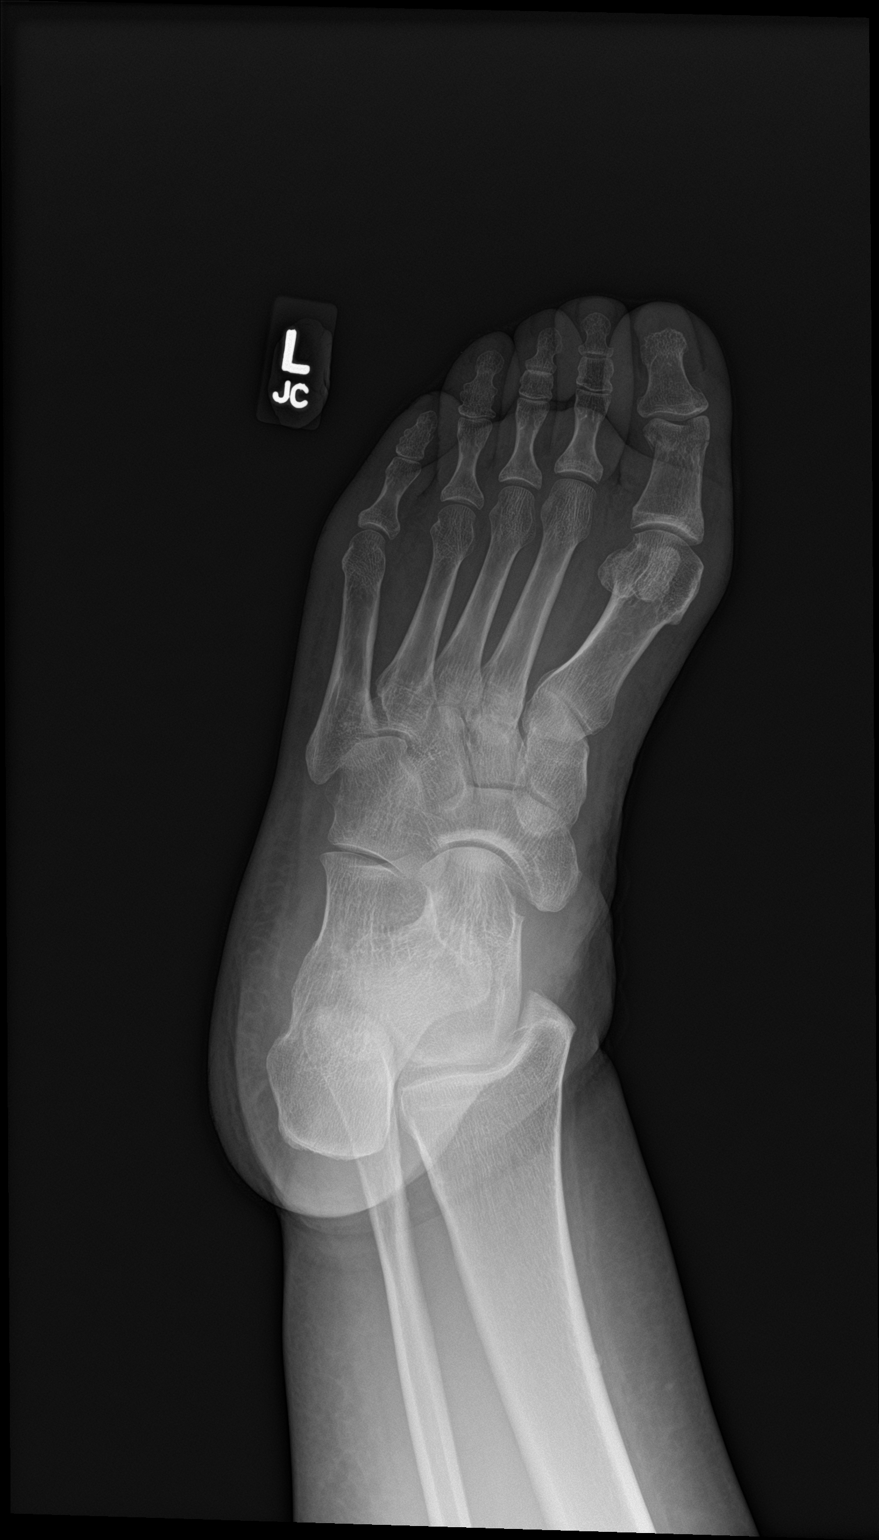

[foot lat]
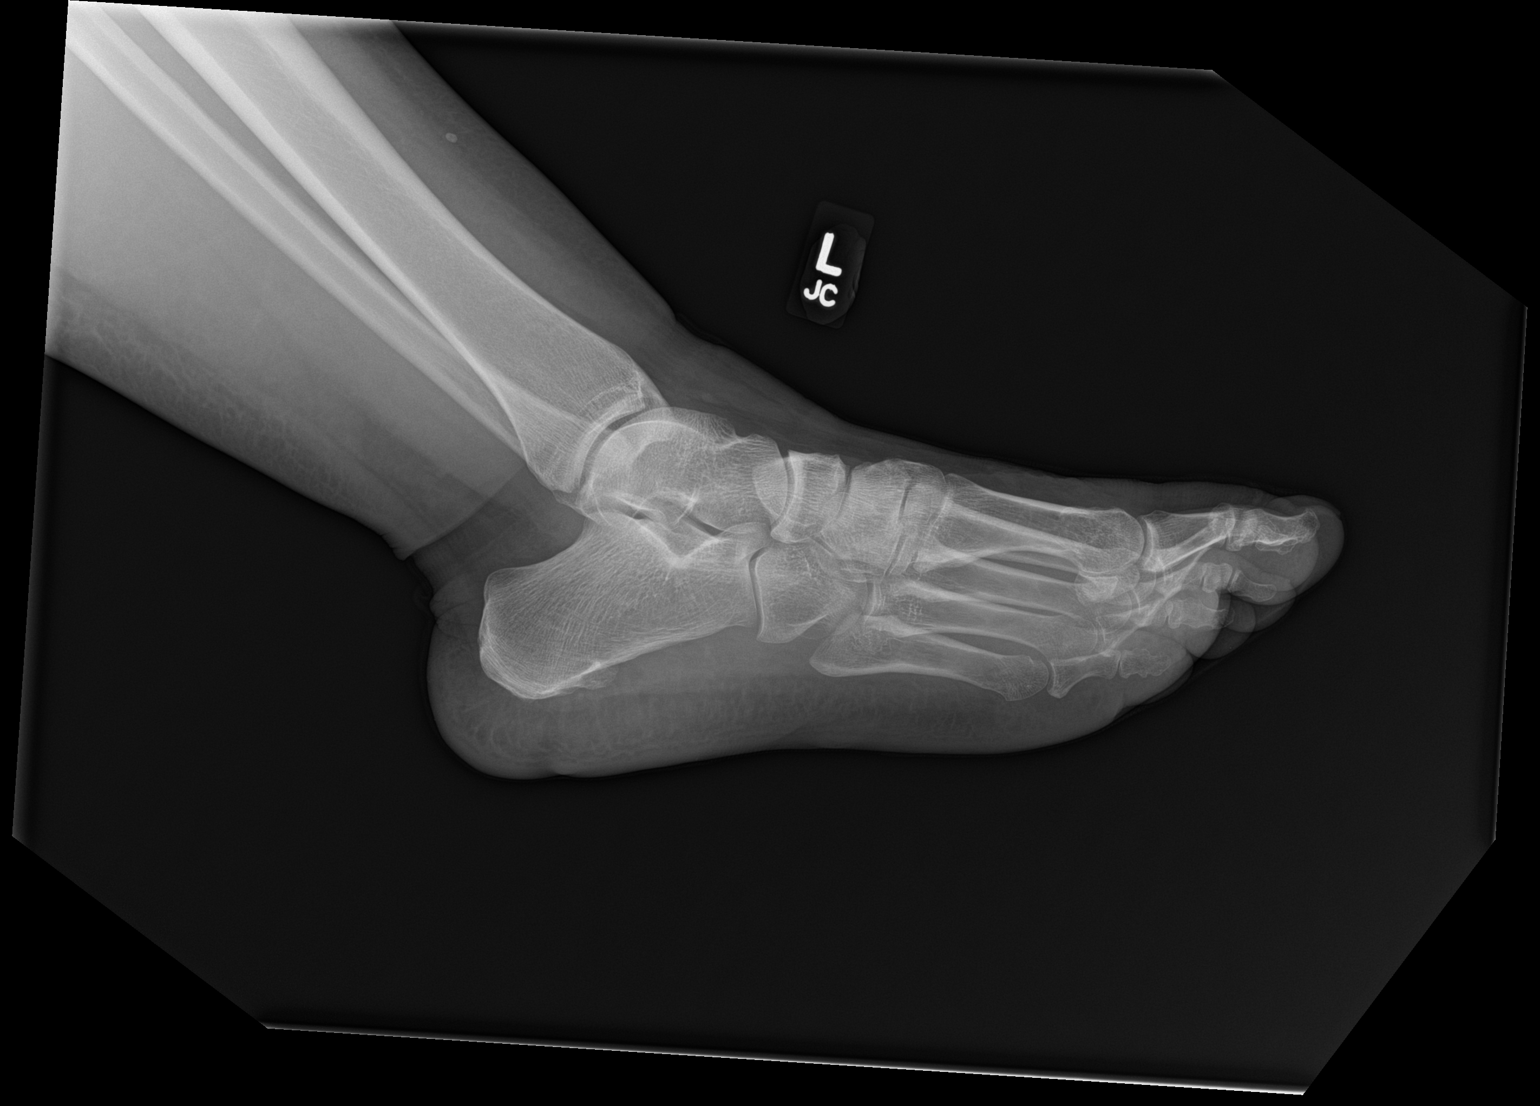

[3 of 3 positions shown; findings below may reference images not displayed]

FINDINGS: Acute intra-articular fracture of the head and neck of the first
digit proximal phalanx. No other acute displaced fracture
identified. No dislocation. There is no evidence of severe
arthropathy or other focal bone abnormality. Soft tissues are
unremarkable.
IMPRESSION: Acute intra-articular fracture of the head and neck of the first
digit proximal phalanx.
